# Patient Record
Sex: Male | Born: 1985 | Race: Black or African American | Hispanic: No | Marital: Single | Smoking: Current every day smoker
Health system: Southern US, Community
[De-identification: ages and names within clinical notes are randomized; demographics above are authoritative.]

## PROBLEM LIST (undated history)

## (undated) DIAGNOSIS — F209 Schizophrenia, unspecified: Secondary | ICD-10-CM

## (undated) DIAGNOSIS — Z21 Asymptomatic human immunodeficiency virus [HIV] infection status: Secondary | ICD-10-CM

## (undated) DIAGNOSIS — B20 Human immunodeficiency virus [HIV] disease: Secondary | ICD-10-CM

## (undated) DIAGNOSIS — R443 Hallucinations, unspecified: Secondary | ICD-10-CM

---
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
## 2008-03-14 ENCOUNTER — Emergency Department (HOSPITAL_COMMUNITY): Admission: EM | Admit: 2008-03-14 | Discharge: 2008-03-14 | Payer: Self-pay | Admitting: Emergency Medicine

## 2011-09-04 LAB — COMPREHENSIVE METABOLIC PANEL
ALT: 22
Alkaline Phosphatase: 62
BUN: 13
CO2: 24
Calcium: 8.9
GFR calc non Af Amer: >60
Glucose, Bld: 84
Potassium: 4
Sodium: 134 — ABNORMAL LOW

## 2011-09-04 LAB — DIFFERENTIAL
Basophils Relative: 0
Eosinophils Absolute: 0
Neutrophils Relative %: 35 — ABNORMAL LOW

## 2011-09-04 LAB — URINALYSIS, ROUTINE W REFLEX MICROSCOPIC
Ketones, ur: NEGATIVE
Leukocytes, UA: NEGATIVE
Nitrite: NEGATIVE
Protein, ur: 100 — AB
Urobilinogen, UA: 0.2

## 2011-09-04 LAB — CBC
HCT: 48.5
Hemoglobin: 17.3 — ABNORMAL HIGH
MCHC: 35.6
RBC: 5.64

## 2011-09-04 LAB — CK: Total CK: 1122 — ABNORMAL HIGH

## 2011-09-04 LAB — LIPASE, BLOOD: Lipase: 47

## 2015-06-22 ENCOUNTER — Encounter (HOSPITAL_COMMUNITY): Payer: Self-pay | Admitting: *Deleted

## 2015-06-22 ENCOUNTER — Emergency Department (HOSPITAL_COMMUNITY)
Admission: EM | Admit: 2015-06-22 | Discharge: 2015-06-23 | Disposition: A | Discharge status: Home / Self Care | Payer: Medicaid - Out of State | Attending: Emergency Medicine | Admitting: Emergency Medicine

## 2015-06-22 DIAGNOSIS — R45851 Suicidal ideations: Secondary | ICD-10-CM | POA: Diagnosis present

## 2015-06-22 DIAGNOSIS — R440 Auditory hallucinations: Secondary | ICD-10-CM | POA: Diagnosis not present

## 2015-06-22 DIAGNOSIS — F39 Unspecified mood [affective] disorder: Secondary | ICD-10-CM

## 2015-06-22 DIAGNOSIS — Z720 Tobacco use: Secondary | ICD-10-CM | POA: Insufficient documentation

## 2015-06-22 DIAGNOSIS — R51 Headache: Secondary | ICD-10-CM | POA: Diagnosis not present

## 2015-06-22 DIAGNOSIS — J029 Acute pharyngitis, unspecified: Secondary | ICD-10-CM | POA: Diagnosis not present

## 2015-06-22 HISTORY — DX: Schizophrenia, unspecified: F20.9

## 2015-06-22 HISTORY — DX: Hallucinations, unspecified: R44.3

## 2015-06-22 LAB — CBC WITH DIFFERENTIAL/PLATELET
BASOS PCT: 0 % (ref 0–1)
Basophils Absolute: 0 10*3/uL (ref 0.0–0.1)
EOS ABS: 0 10*3/uL (ref 0.0–0.7)
Eosinophils Relative: 1 % (ref 0–5)
HEMATOCRIT: 37.7 % — AB (ref 39.0–52.0)
Hemoglobin: 12 g/dL — ABNORMAL LOW (ref 13.0–17.0)
Lymphocytes Relative: 38 % (ref 12–46)
Lymphs Abs: 1.3 10*3/uL (ref 0.7–4.0)
MCH: 29.9 pg (ref 26.0–34.0)
MCHC: 31.8 g/dL (ref 30.0–36.0)
MCV: 93.8 fL (ref 78.0–100.0)
MONO ABS: 0.3 10*3/uL (ref 0.1–1.0)
Monocytes Relative: 8 % (ref 3–12)
NEUTROS ABS: 1.9 10*3/uL (ref 1.7–7.7)
NEUTROS PCT: 53 % (ref 43–77)
PLATELETS: 206 10*3/uL (ref 150–400)
RBC: 4.02 MIL/uL — ABNORMAL LOW (ref 4.22–5.81)
RDW: 13.7 % (ref 11.5–15.5)
WBC: 3.5 10*3/uL — ABNORMAL LOW (ref 4.0–10.5)

## 2015-06-22 LAB — COMPREHENSIVE METABOLIC PANEL
ALBUMIN: 3.4 g/dL — AB (ref 3.5–5.0)
ALT: 12 U/L — AB (ref 17–63)
AST: 26 U/L (ref 15–41)
Alkaline Phosphatase: 55 U/L (ref 38–126)
Anion gap: 6 (ref 5–15)
BILIRUBIN TOTAL: 0.5 mg/dL (ref 0.3–1.2)
BUN: 12 mg/dL (ref 6–20)
CO2: 31 mmol/L (ref 22–32)
CREATININE: 0.98 mg/dL (ref 0.61–1.24)
Calcium: 9 mg/dL (ref 8.9–10.3)
Chloride: 102 mmol/L (ref 101–111)
GFR calc non Af Amer: >60 mL/min (ref >60)
Glucose, Bld: 84 mg/dL (ref 65–99)
POTASSIUM: 3.5 mmol/L (ref 3.5–5.1)
Sodium: 139 mmol/L (ref 135–145)
Total Protein: 8.8 g/dL — ABNORMAL HIGH (ref 6.5–8.1)

## 2015-06-22 LAB — RAPID URINE DRUG SCREEN, HOSP PERFORMED
AMPHETAMINES: POSITIVE — AB
BARBITURATES: NOT DETECTED
Benzodiazepines: NOT DETECTED
COCAINE: NOT DETECTED
OPIATES: NOT DETECTED
Tetrahydrocannabinol: POSITIVE — AB

## 2015-06-22 LAB — ETHANOL: Alcohol, Ethyl (B): <5 mg/dL (ref <5)

## 2015-06-22 MED ORDER — LORAZEPAM 1 MG PO TABS: 1.0000 mg | ORAL_TABLET | Freq: Three times a day (TID) | ORAL | Status: DC | PRN

## 2015-06-22 MED ORDER — ONDANSETRON HCL 4 MG PO TABS: 4.0000 mg | ORAL_TABLET | Freq: Three times a day (TID) | ORAL | Status: DC | PRN

## 2015-06-22 MED ORDER — IBUPROFEN 200 MG PO TABS
600.0000 mg | ORAL_TABLET | Freq: Three times a day (TID) | ORAL | Status: DC | PRN
  Administered 2015-06-22 – 2015-06-23 (×3): 600 mg via ORAL
  Filled 2015-06-22 (×3): qty 3

## 2015-06-22 MED ORDER — ALUM & MAG HYDROXIDE-SIMETH 200-200-20 MG/5ML PO SUSP: 30.0000 mL | ORAL | Status: DC | PRN

## 2015-06-22 MED ORDER — ACETAMINOPHEN 325 MG PO TABS: 650.0000 mg | ORAL_TABLET | ORAL | Status: DC | PRN

## 2015-06-22 NOTE — ED Notes (Addendum)
Pt reports voices saying "failure, slut, nothing, die, die." Pt is looking around paranoid and states that he feels like something is following him. Pt says that he has heard voices of a conversation for the past two months.Pt says that two months ago he went to a shelter and the voices started. He also reports stress related to losing his uncle two years ago that he was close to.

## 2015-06-22 NOTE — ED Notes (Signed)
Pt states "I don't know if I want to kill someone or myself", pt states he is hearing voices, states he has conversations with people he knows is not there, pt also complaining of L throat pain and head pressure.

## 2015-06-22 NOTE — ED Notes (Signed)
Pt AAO x 3, denies SI, continues to  [employer] Media[employer] voices, calm & cooperative, no distress noted, watching TV at present.  Monitoring for safety, Q 15 min checks in effect.

## 2015-06-22 NOTE — ED Provider Notes (Signed)
CSN: [id number]     Arrival date & time 06/22/15  0605 History   First MD Initiated Contact with Patient 06/22/15 [id number]     Chief Complaint  Patient presents with  . Medical Clearance     (Consider location/radiation/quality/duration/timing/severity/associated sxs/prior Treatment) HPI Comments: Patient with h/o HIV on no medications, presents c/o hearing non-instructive voices x 2 months. States that they can be negative or positive. He states that sometimes he gets very angry and wants to hurt people but has no definite plan to hurt anyone in particular. He tells me that he does not really want to hurt himself. Also asks if contact lenses can transmit images elsewhere and that he has been paranoid lately. States he occasionally drinks wine. Denies other drugs. He states that [place] is [place] from [place] and [place]. 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  His only medical complaint is that he has had L sided sore throat and HA x 2 months as well. No fever, vision change, N/V/D. No URI sx. No difficulty breathing or swallowing.    The history is provided by the patient.    Past Medical History  Diagnosis Date  . Schizophrenia   . Hallucinations    History reviewed. No pertinent past surgical history. No family history on file. History  Substance Use Topics  . Smoking status: Current Every Day Smoker  . Smokeless tobacco: Never Used  . Alcohol Use: Yes    Review of Systems  Constitutional: Negative for fever.  HENT: Positive for sore throat. Negative for ear pain, rhinorrhea, trouble swallowing and voice change.   Eyes: Negative for redness.  Respiratory: Negative for cough.   Cardiovascular: Negative for chest pain.  Gastrointestinal: Negative for nausea, vomiting, abdominal pain and diarrhea.  Genitourinary: Negative for dysuria.  Musculoskeletal: Negative for myalgias.  Skin: Negative for  rash.  Neurological: Positive for headaches.    Allergies  Review of patient's allergies indicates not on file.  Home Medications   Prior to Admission medications   Not on File   BP 171/103 mmHg  Pulse 63  Temp(Src) 98.3 F (36.8 C) (Oral)  Resp 20  Ht 5\' 11"  (1.803 m)  Wt 187 lb (84.823 kg)  BMI 26.09 kg/m2  SpO2 100%   Physical Exam  Constitutional: He appears well-developed and well-nourished.  HENT:  Head: Normocephalic and atraumatic.  Right Ear: Tympanic membrane, external ear and ear canal normal.  Left Ear: Tympanic membrane, external ear and ear canal normal.  Nose: Nose normal. No mucosal edema or rhinorrhea.  Mouth/Throat: Oropharynx is clear and moist and mucous membranes are normal. Mucous membranes are not dry. No trismus in the jaw. No dental abscesses or uvula swelling. No oropharyngeal exudate, posterior oropharyngeal edema or posterior oropharyngeal erythema.  Eyes: Conjunctivae are normal. Right eye exhibits no discharge. Left eye exhibits no discharge.  Neck: Normal range of motion. Neck supple.  Cardiovascular: Normal rate, regular rhythm and normal heart sounds.   No murmur heard. Pulmonary/Chest: Effort normal and breath sounds normal. No respiratory distress. He has no wheezes. He has no rales.  Abdominal: Soft. There is no tenderness. There is no rebound and no guarding.  Neurological: He is alert.  Skin: Skin is warm and dry.  Psychiatric: His speech is normal. His affect is labile. He is actively hallucinating. He is not agitated, not aggressive, not hyperactive, not slowed, not withdrawn and not combative.  Thought content is paranoid. He expresses no homicidal and no suicidal ideation. He expresses no suicidal plans and no homicidal plans. He is attentive.  Nursing note and vitals reviewed.   ED Course  Procedures (including critical care time) Labs Review Labs Reviewed  CBC WITH DIFFERENTIAL/PLATELET - Abnormal; Notable for the following:     WBC 3.5 (*)    RBC 4.02 (*)    Hemoglobin 12.0 (*)    HCT 37.7 (*)    All other components within normal limits  COMPREHENSIVE METABOLIC PANEL - Abnormal; Notable for the following:    Total Protein 8.8 (*)    Albumin 3.4 (*)    ALT 12 (*)    All other components within normal limits  URINE RAPID DRUG SCREEN, HOSP PERFORMED - Abnormal; Notable for the following:    Amphetamines POSITIVE (*)    Tetrahydrocannabinol POSITIVE (*)    All other components within normal limits  ETHANOL    Imaging Review No results found.   EKG Interpretation None       6:29 AM Patient seen and examined. Medical clearance underway. No indications for IVC from history. He does not state any active SI/HI.    Vital signs reviewed and are as follows: BP 171/103 mmHg  Pulse 63  Temp(Src) 98.3 F (36.8 C) (Oral)  Resp 20  Ht  (1.803 m)  Wt 187 lb (84.823 kg)  BMI 26.09 kg/m2  SpO2 100%  7:54 AM Pt is medically cleared. UDS is positive for amphetamines and THC. Pending TTS eval.    MDM   Final diagnoses:  Auditory hallucinations   Pending completion of psych eval and disposition decision.     Renne Crigler, PA-C 06/22/15 1620  Layla Maw Ward, DO 06/23/15 0002

## 2015-06-22 NOTE — BH Assessment (Signed)
Sent referrals to: [facility]    Allyne Hebert, [place] Triage Specialist 06/22/2015 10:52 PM
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
## 2015-06-22 NOTE — ED Notes (Signed)
[id number] Expected date:  Expected time:  Means of arrival:  Comments: rm 7

## 2015-06-22 NOTE — ED Notes (Signed)
Pt reports recently experiencing the loss of his uncle which has caused strain on pt and his support system - pt admits to hx of auditory hallucinations, reports he has been again hearing voices for a few days, states they tell him to do things "sometimes to include fighting or hurting other people but i can't fight everyone, and sometimes to hurt myself." Pt denies suicidal plan. Pt calm and cooperative at present. Pt also admits to recent experimenting w/ drugs/medications - took x7 tabs of tramadol 2.5mg  approx 2 weeks ago, taking xanax and took a new street drug recently.

## 2015-06-23 DIAGNOSIS — R440 Auditory hallucinations: Secondary | ICD-10-CM

## 2015-06-23 MED ORDER — PSEUDOEPHEDRINE HCL ER 120 MG PO TB12: 240.0000 mg | ORAL_TABLET | Freq: Once | ORAL | Status: DC

## 2015-06-23 MED ORDER — LORATADINE 10 MG PO TABS
10.0000 mg | ORAL_TABLET | Freq: Once | ORAL | Status: AC
  Administered 2015-06-23: 10 mg via ORAL
  Filled 2015-06-23 (×2): qty 1

## 2015-06-23 MED ORDER — PSEUDOEPHEDRINE HCL ER 120 MG PO TB12
120.0000 mg | ORAL_TABLET | Freq: Once | ORAL | Status: AC
  Administered 2015-06-23: 120 mg via ORAL
  Filled 2015-06-23: qty 1

## 2015-06-23 NOTE — Discharge Instructions (Signed)
For your ongoing behavioral health needs you are advised to follow up with [facility].  New patients are seen at their walk-in clinic.  Walk-in hours are Monday - Friday from 8:00 am - 12:00 pm, and from 1:00 pm - 3:00 pm.  Walk-in patients are seen on a first come, first served basis, so try to arrive as early as possible for the best chance of being seen the same day.  There is an initial fee of $22.50: ° °     [facility] °     [address] °     [place], [place] [address] °     [phone] °

## 2015-06-23 NOTE — ED Notes (Addendum)
Bus pass given, shelter and [facility] given to [facility].  Pt ambulatory w/o difficulty to dc window w/ mHt.  Belongings returned after leaving the arera.

## 2015-06-23 NOTE — BH Assessment (Signed)
[facility] Assessment Progress Note  Per Thedore MinsMojeed Akintayo, MD, this pt does not require psychiatric hospitalization at this time.  [facility]  [facility]charge [facility]  Pt's nurse has been notified.  Doylene Canninghomas Nakiya Rallis, MA Triage Specialist [phone]

## 2015-06-23 NOTE — [facility] Suicide Risk Assessment (Cosign Needed)
Suicide Risk Assessment  Discharge Assessment   [facility]   Demographic Factors:  Male, Low socioeconomic status and Unemployed  Total Time spent with patient: 20 minutes  Musculoskeletal: Strength & Muscle Tone: within normal limits Gait & Station: normal Patient leans: N/A  Psychiatric Specialty Exam:     Blood pressure 145/89, pulse 76, temperature 98 F (36.7 C), temperature source Oral, resp. rate 18, height 5\' 11"  (1.803 m), weight 84.823 kg (187 lb), SpO2 97 %.Body mass index is 26.09 kg/(m^2).  General Appearance: Casual  Eye Contact::  Good  Speech:  Clear and Coherent and Normal Rate  Volume:  Normal  Mood:  Euthymic  Affect:  Congruent  Thought Process:  Coherent, Goal Directed and Intact  Orientation:  Full (Time, Place, and Person)  Thought Content:  WDL  Suicidal Thoughts:  No  Homicidal Thoughts:  No  Memory:  Immediate;   Fair Recent;   Fair Remote;   Fair  Judgement:  Fair  Insight:  Lacking  Psychomotor Activity:  Normal  Concentration:  Good  Recall:  NA  Fund of Knowledge:Poor  Language: Good  Akathisia:  NA  Handed:  Right  AIMS (if indicated):     Assets:  Desire for Improvement  ADL's:  Intact  Cognition: WNL  Sleep:           Has this patient used any form of tobacco in the last 30 days? (Cigarettes, Smokeless Tobacco, Cigars, and/or Pipes) No  Mental Status Per Nursing Assessment::   On Admission:     Current Mental Status by Physician: NA  Loss Factors: NA  Historical Factors: NA  Risk Reduction Factors:   Religious beliefs about death and Living with another person, especially a relative  Continued Clinical Symptoms:  Alcohol/Substance Abuse/Dependencies  Cognitive Features That Contribute To Risk:  Polarized thinking    Suicide Risk:  Minimal: No identifiable suicidal ideation.  Patients presenting with no risk factors but with morbid ruminations; may be classified as minimal risk based  on the severity of the depressive symptoms  Principal Problem: Mood disorder Discharge Diagnoses:  Patient Active Problem List   Diagnosis Date Noted  . Mood disorder [F39] 06/22/2015    Priority: High  . Auditory hallucinations [R44.0]       Plan Of Care/Follow-up recommendations:  Activity:  as tolerated Diet:  regular  Is patient on multiple antipsychotic therapies at discharge:  No   Has Patient had three or more failed trials of antipsychotic monotherapy by history:  No  Recommended Plan for Multiple Antipsychotic Therapies: NA    Jesiel Garate C   PMHNP-BC 06/23/2015, 11:23 AM
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
## 2015-06-23 NOTE — ED Notes (Addendum)
Written dc instructions reviewed w/ Pt.  [facility] and start treatment.  Pt encouraged to return/seek help for any suicidal/homicidal thoughts or urges.  Pt verbalized understanding. Pt also encouraged to follow up at the OP clinics for his medical problems.

## 2015-06-27 ENCOUNTER — Emergency Department (HOSPITAL_COMMUNITY)
Admission: EM | Admit: 2015-06-27 | Discharge: 2015-06-27 | Disposition: A | Discharge status: Home / Self Care | Payer: Medicaid - Out of State | Attending: Emergency Medicine | Admitting: Emergency Medicine

## 2015-06-27 ENCOUNTER — Encounter (HOSPITAL_COMMUNITY): Payer: Self-pay

## 2015-06-27 ENCOUNTER — Emergency Department (HOSPITAL_COMMUNITY): Payer: Medicaid - Out of State

## 2015-06-27 DIAGNOSIS — Z79899 Other long term (current) drug therapy: Secondary | ICD-10-CM | POA: Diagnosis not present

## 2015-06-27 DIAGNOSIS — K088 Other specified disorders of teeth and supporting structures: Secondary | ICD-10-CM | POA: Diagnosis present

## 2015-06-27 DIAGNOSIS — Z8659 Personal history of other mental and behavioral disorders: Secondary | ICD-10-CM | POA: Diagnosis not present

## 2015-06-27 DIAGNOSIS — M542 Cervicalgia: Secondary | ICD-10-CM | POA: Diagnosis not present

## 2015-06-27 DIAGNOSIS — Z720 Tobacco use: Secondary | ICD-10-CM | POA: Diagnosis not present

## 2015-06-27 DIAGNOSIS — J018 Other acute sinusitis: Secondary | ICD-10-CM | POA: Insufficient documentation

## 2015-06-27 MED ORDER — AMOXICILLIN 500 MG PO CAPS: 500.0000 mg | ORAL_CAPSULE | Freq: Three times a day (TID) | ORAL | Status: AC

## 2015-06-27 MED ORDER — ACETAMINOPHEN 325 MG PO TABS
650.0000 mg | ORAL_TABLET | Freq: Once | ORAL | Status: AC
  Administered 2015-06-27: 650 mg via ORAL
  Filled 2015-06-27: qty 2

## 2015-06-27 NOTE — ED Provider Notes (Signed)
CSN: [id number]     Arrival date & time 06/27/15  [id number] History  This chart was scribed for non-physician practitioner, Teressa LowerVrinda Donnavan Covault, NP, working with Pricilla LovelessScott Goldston, MD, by Budd PalmerVanessa Prueter ED Scribe. This patient was seen in room TR04C/TR04C and the patient's care was started at 8:16 PM    Chief Complaint  Patient presents with  . Dental Pain   The history is provided by the patient. No language interpreter was used.   HPI Comments: Carlos Wilkerson is a 29 y.o. male smoker who presents to the Emergency Department complaining of stabbing, left-sided dental pain onset a few days ago. Pt reports associated congestion, as well as left-sided head, facial, and throat pain. He currently rates his pain at a 5-6/10. He also notes intermittent left-sided chest pain. He reports a subjective fever when he first arrived. He was prescribed Claritin D by his PCP in [place], with no relief. He has come to [place] to take care of his grandmother. He has a PMHx of allergies. He does not take any other medications. Pt denies a PMHx of asthma.   Past Medical History  Diagnosis Date  . Schizophrenia   . Hallucinations    History reviewed. No pertinent past surgical history. No family history on file. History  Substance Use Topics  . Smoking status: Current Every Day Smoker  . Smokeless tobacco: Never Used  . Alcohol Use: Yes    Review of Systems  HENT: Positive for congestion, dental problem and sore throat.   Cardiovascular: Positive for chest pain (left-sided).  Musculoskeletal: Positive for neck pain.  All other systems reviewed and are negative.   Allergies  Review of patient's allergies indicates no known allergies.  Home Medications   Prior to Admission medications   Medication Sig Start Date End Date Taking? Authorizing Provider  loratadine-pseudoephedrine (CLARITIN-D 24-HOUR) 10-240 MG per 24 hr tablet Take 1 tablet by mouth daily.    Historical Provider, MD  Soft Lens Products (REWETTING  DROPS) SOLN Place 1 drop into both eyes 4 (four) times daily as needed (for dry eyes).    Historical Provider, MD   BP 147/84 mmHg  Pulse 80  Temp(Src) 98.4 F (36.9 C)  Ht 5\' 11"  (1.803 m)  Wt 187 lb (84.823 kg)  BMI 26.09 kg/m2 Physical Exam  Constitutional: He is oriented to person, place, and time. He appears well-developed and well-nourished. No distress.  HENT:  Head: Normocephalic and atraumatic.  Right Ear: External ear normal.  Left Ear: External ear normal.  Nose: Left sinus exhibits maxillary sinus tenderness and frontal sinus tenderness.  Mouth/Throat: Oropharynx is clear and moist.  Eyes: Conjunctivae and EOM are normal. Pupils are equal, round, and reactive to light.  Neck: Normal range of motion. Neck supple. No tracheal deviation present.  Cardiovascular: Normal rate.   Pulmonary/Chest: Breath sounds normal. No respiratory distress.  Abdominal: Soft.  Musculoskeletal: Normal range of motion.  Neurological: He is alert and oriented to person, place, and time.  Skin: Skin is warm and dry.  Psychiatric: He has a normal mood and affect. His behavior is normal.  Nursing note and vitals reviewed.   ED Course  Procedures  DIAGNOSTIC STUDIES:     COORDINATION OF CARE: 8:19 PM - Discussed plans to order a chest XR. Pt advised of plan for treatment and pt agrees.  Labs Review Labs Reviewed - No data to display  Imaging Review Dg Chest 2 View  06/27/2015   CLINICAL DATA:  29 year old male with chest pain  EXAM: CHEST  2 VIEW  COMPARISON:  None.  FINDINGS: The heart size and mediastinal contours are within normal limits. Both lungs are clear. The visualized skeletal structures are unremarkable.  IMPRESSION: No active cardiopulmonary disease.   Electronically Signed   By: Elgie Collard M.D.   On: 06/27/2015 21:09   ED ECG REPORT   Date: 06/27/2015  Rate: 73  Rhythm: sinus arrhythmia  QRS Axis: normal  Intervals: normal  ST/T Wave abnormalities: normal   Conduction Disutrbances:none  Narrative Interpretation:   Old EKG Reviewed: none available  I have personally reviewed the EKG tracing and agree with the computerized printout as noted.   MDM   Final diagnoses:  Other acute sinusitis    Doubt acs. Will treat for sinusitis with amoxicillin. Likely bronchitic symptoms  I personally performed the services described in this documentation, which was scribed in my presence. The recorded information has been reviewed and is accurate.  Teressa Lower, NP 06/27/15 1610  Pricilla Loveless, MD 06/29/15 1910

## 2015-06-27 NOTE — ED Notes (Signed)
NP at the bedside

## 2015-06-27 NOTE — Discharge Instructions (Signed)
Sinusitis °Sinusitis is redness, soreness, and puffiness (inflammation) of the air pockets in the bones of your face (sinuses). The redness, soreness, and puffiness can cause air and mucus to get trapped in your sinuses. This can allow germs to grow and cause an infection.  °HOME CARE  °· Drink enough fluids to keep your pee (urine) clear or pale yellow. °· Use a humidifier in your home. °· Run a hot shower to create steam in the bathroom. Sit in the bathroom with the door closed. Breathe in the steam 3-4 times a day. °· Put a warm, moist washcloth on your face 3-4 times a day, or as told by your doctor. °· Use salt water sprays (saline sprays) to wet the thick fluid in your nose. This can help the sinuses drain. °· Only take medicine as told by your doctor. °GET HELP RIGHT AWAY IF:  °· Your pain gets worse. °· You have very bad headaches. °· You are sick to your stomach (nauseous). °· You throw up (vomit). °· You are very sleepy (drowsy) all the time. °· Your face is puffy (swollen). °· Your vision changes. °· You have a stiff neck. °· You have trouble breathing. °MAKE SURE YOU:  °· Understand these instructions. °· Will watch your condition. °· Will get help right away if you are not doing well or get worse. °Document Released: 05/14/2008 Document Revised: 08/20/2012 Document Reviewed: 07/01/2012 °[employer]® Patient Information ©2015 [employer]. This information is not intended to replace advice given to you by your health care provider. Make sure you discuss any questions you have with your health care provider. ° °

## 2015-06-27 NOTE — ED Notes (Signed)
Per Patient, Patient started to have pain in his mouth on the left side that is a stabbing pain. Patient reports left sided throat pain and congestion. Patient reports having history of allergies and headache. Patient has been taking Claritin D with no relief.

## 2017-01-03 ENCOUNTER — Emergency Department (HOSPITAL_COMMUNITY)
Admission: EM | Admit: 2017-01-03 | Discharge: 2017-01-04 | Disposition: A | Discharge status: Home / Self Care | Payer: Medicaid Other | Attending: Emergency Medicine | Admitting: Emergency Medicine

## 2017-01-03 ENCOUNTER — Encounter (HOSPITAL_COMMUNITY): Payer: Self-pay | Admitting: Emergency Medicine

## 2017-01-03 DIAGNOSIS — F17200 Nicotine dependence, unspecified, uncomplicated: Secondary | ICD-10-CM | POA: Insufficient documentation

## 2017-01-03 DIAGNOSIS — G43909 Migraine, unspecified, not intractable, without status migrainosus: Secondary | ICD-10-CM | POA: Insufficient documentation

## 2017-01-03 DIAGNOSIS — J019 Acute sinusitis, unspecified: Secondary | ICD-10-CM | POA: Diagnosis not present

## 2017-01-03 NOTE — ED Triage Notes (Signed)
PT RECEIVED FROM HOME VIA EMS C/O MIGRAINE X2-3 WEEKS. PER EMS, THE PT ALSO C/O BLURRED VISION AND LIGHT SENSITIVITY. PAIN IS 9/10.

## 2017-01-04 MED ORDER — DIPHENHYDRAMINE HCL 25 MG PO CAPS
25.0000 mg | ORAL_CAPSULE | Freq: Once | ORAL | Status: AC
  Administered 2017-01-04: 25 mg via ORAL
  Filled 2017-01-04: qty 1

## 2017-01-04 MED ORDER — SODIUM CHLORIDE 0.9 % IV BOLUS (SEPSIS)
1000.0000 mL | Freq: Once | INTRAVENOUS | Status: AC
  Administered 2017-01-04: 1000 mL via INTRAVENOUS

## 2017-01-04 MED ORDER — METOCLOPRAMIDE HCL 5 MG/ML IJ SOLN
10.0000 mg | Freq: Once | INTRAMUSCULAR | Status: DC
  Filled 2017-01-04: qty 2

## 2017-01-04 MED ORDER — METOCLOPRAMIDE HCL 10 MG PO TABS
10.0000 mg | ORAL_TABLET | Freq: Once | ORAL | Status: AC
  Administered 2017-01-04: 10 mg via ORAL
  Filled 2017-01-04: qty 1

## 2017-01-04 MED ORDER — PSEUDOEPHEDRINE HCL ER 120 MG PO TB12: 120.0000 mg | ORAL_TABLET | Freq: Two times a day (BID) | ORAL | 0 refills | Status: AC

## 2017-01-04 MED ORDER — DEXAMETHASONE 4 MG PO TABS
10.0000 mg | ORAL_TABLET | Freq: Once | ORAL | Status: AC
  Administered 2017-01-04: 10 mg via ORAL
  Filled 2017-01-04: qty 2

## 2017-01-04 MED ORDER — HALOPERIDOL LACTATE 5 MG/ML IJ SOLN
2.0000 mg | Freq: Once | INTRAMUSCULAR | Status: AC
  Administered 2017-01-04: 2 mg via INTRAVENOUS
  Filled 2017-01-04: qty 1

## 2017-01-04 MED ORDER — FLUTICASONE PROPIONATE 50 MCG/ACT NA SUSP: 2.0000 | Freq: Every day | NASAL | 0 refills | Status: AC

## 2017-01-04 MED ORDER — KETOROLAC TROMETHAMINE 15 MG/ML IJ SOLN
15.0000 mg | Freq: Once | INTRAMUSCULAR | Status: AC
  Administered 2017-01-04: 15 mg via INTRAVENOUS
  Filled 2017-01-04: qty 1

## 2017-01-04 MED ORDER — AMOXICILLIN-POT CLAVULANATE 875-125 MG PO TABS: 1.0000 | ORAL_TABLET | Freq: Two times a day (BID) | ORAL | 0 refills | Status: AC

## 2017-01-04 MED ORDER — DIPHENHYDRAMINE HCL 50 MG/ML IJ SOLN
25.0000 mg | Freq: Once | INTRAMUSCULAR | Status: DC
  Filled 2017-01-04: qty 1

## 2017-01-04 NOTE — ED Provider Notes (Signed)
[facility]-EMERGENCY DEPT Provider Note   CSN: [id number] Arrival date & time: 01/03/17  1936     History   Chief Complaint Chief Complaint  Patient presents with  . Migraine  . URI    HPI Carlos Wilkerson is a 31 y.o. male.  HPI 31 yo M with h/o schizophrenia, chronic HA here with headache, sinus congestion. Pt endorses 1 week of nasal congestion, sore throat, and sensation of postnasal drainage. Over the past several days, he has developed an aching, throbbing, left sided HA similar ot his usual migraines. No photophobia, phonophobia, or neck stiffness. HA feels similar to his usual migraines. No known fevers. He does have seasonal allergies. HA worsens with loud noises. Has not tried any medications. Has not seen a neurologist. He has had negative CT in past per report.   Past Medical History:  Diagnosis Date  . Hallucinations   . Schizophrenia [facility])     Patient Active Problem List   Diagnosis Date Noted  . Auditory hallucinations   . Mood disorder (HCC) 06/22/2015    History reviewed. No pertinent surgical history.     Home Medications    Prior to Admission medications   Medication Sig Start Date End Date Taking? Authorizing Provider  amoxicillin (AMOXIL) 500 MG capsule Take 1 capsule (500 mg total) by mouth 3 (three) times daily. 06/27/15   Teressa Lower, NP  amoxicillin-clavulanate (AUGMENTIN) 875-125 MG tablet Take 1 tablet by mouth every 12 (twelve) hours. 01/04/17 01/14/17  Shaune Pollack, MD  fluticasone (FLONASE) 50 MCG/ACT nasal spray Place 2 sprays into both nostrils daily. 01/04/17 02/03/17  Shaune Pollack, MD  loratadine-pseudoephedrine (CLARITIN-D 24-HOUR) 10-240 MG per 24 hr tablet Take 1 tablet by mouth daily.    Historical Provider, MD  pseudoephedrine (SUDAFED 12 HOUR) 120 MG 12 hr tablet Take 1 tablet (120 mg total) by mouth 2 (two) times daily. 01/04/17 01/11/17  Shaune Pollack, MD  Soft Lens Products (REWETTING DROPS) SOLN Place 1 drop into both eyes 4  (four) times daily as needed (for dry eyes).    Historical Provider, MD    Family History No family history on file.  Social History Social History  Substance Use Topics  . Smoking status: Current Every Day Smoker  . Smokeless tobacco: Never Used  . Alcohol use No     Allergies   Patient has no known allergies.   Review of Systems Review of Systems  Constitutional: Positive for fatigue. Negative for chills and fever.  HENT: Positive for congestion, postnasal drip, rhinorrhea and sore throat.   Eyes: Negative for visual disturbance.  Respiratory: Positive for cough. Negative for shortness of breath and wheezing.   Cardiovascular: Negative for chest pain and leg swelling.  Gastrointestinal: Negative for abdominal pain, diarrhea, nausea and vomiting.  Genitourinary: Negative for dysuria and flank pain.  Musculoskeletal: Negative for neck pain and neck stiffness.  Skin: Negative for rash and wound.  Allergic/Immunologic: Negative for immunocompromised state.  Neurological: Positive for headaches. Negative for syncope and weakness.  Psychiatric/Behavioral: Negative for suicidal ideas.  All other systems reviewed and are negative.    Physical Exam Updated Vital Signs BP 117/71   Pulse 61   Temp 98.3 F (36.8 C) (Oral)   Resp 20   Ht 5\' 11"  (1.803 m)   Wt 190 lb (86.2 kg)   SpO2 95%   BMI 26.50 kg/m   Physical Exam  Constitutional: He is oriented to person, place, and time. He appears well-developed and well-nourished. No distress.  HENT:  Head: Normocephalic and atraumatic.  Right Ear: Tympanic membrane and external ear normal.  Left Ear: Tympanic membrane and external ear normal.  Nose: Mucosal edema present. Right sinus exhibits frontal sinus tenderness. Left sinus exhibits frontal sinus tenderness.  Mouth/Throat: Uvula is midline, oropharynx is clear and moist and mucous membranes are normal. No posterior oropharyngeal edema or posterior oropharyngeal erythema.    Eyes: Conjunctivae are normal.  Neck: Neck supple.  Cardiovascular: Normal rate, regular rhythm and normal heart sounds.  Exam reveals no friction rub.   No murmur heard. Pulmonary/Chest: Effort normal and breath sounds normal. No respiratory distress. He has no wheezes. He has no rales.  Abdominal: He exhibits no distension.  Musculoskeletal: He exhibits no edema.  Neurological: He is alert and oriented to person, place, and time. He exhibits normal muscle tone.  Skin: Skin is warm. Capillary refill takes less than 2 seconds.  Psychiatric: He has a normal mood and affect. His speech is normal and behavior is normal. Thought content normal. Thought content is not paranoid and not delusional. He expresses no homicidal and no suicidal ideation.  Nursing note and vitals reviewed.   Neurological Exam:  Mental Status: Alert and oriented to person, place, and time. Attention and concentration normal. Speech clear. Recent memory is intact. Cranial Nerves: Visual fields grossly intact. EOMI and PERRLA. No nystagmus noted. Facial sensation intact at forehead, maxillary cheek, and chin/mandible bilaterally. No facial asymmetry or weakness. Hearing grossly normal. Uvula is midline, and palate elevates symmetrically. Normal SCM and trapezius strength. Tongue midline without fasciculations. Motor: Muscle strength 5/5 in proximal and distal UE and LE bilaterally. No pronator drift. Muscle tone normal. Reflexes: 2+ and symmetrical in all four extremities.  Sensation: Intact to light touch in upper and lower extremities distally bilaterally.  Gait: Normal without ataxia. Coordination: Normal FTN bilaterally.   ED Treatments / Results  Labs (all labs ordered are listed, but only abnormal results are displayed) Labs Reviewed - No data to display  EKG  EKG Interpretation None       Radiology No results found.  Procedures Procedures (including critical care time)  Medications Ordered in  ED Medications  dexamethasone (DECADRON) tablet 10 mg (10 mg Oral Given 01/04/17 0105)  metoCLOPramide (REGLAN) tablet 10 mg (10 mg Oral Given 01/04/17 0115)  diphenhydrAMINE (BENADRYL) capsule 25 mg (25 mg Oral Given 01/04/17 0115)  sodium chloride 0.9 % bolus 1,000 mL (0 mLs Intravenous Stopped 01/04/17 0445)  ketorolac (TORADOL) 15 MG/ML injection 15 mg (15 mg Intravenous Given 01/04/17 0230)  haloperidol lactate (HALDOL) injection 2 mg (2 mg Intravenous Given 01/04/17 0233)     Initial Impression / Assessment and Plan / ED Course  I have reviewed the triage vital signs and the nursing notes.  Pertinent labs & imaging results that were available during my care of the patient were reviewed by me and considered in my medical decision making (see chart for details).     31 yo M with PMHx as above here with sinus congestion, frontal HA, and cough. Suspect viral URi with possible superimposed sinusitis. Will treat with flonase, augmentin. No fever, neck stiffness, or s/s meningitis or encephalitis. His HA is similar to his chronic migraines and I do not suspect SAH, intracranial mass/lesion, or infectious process. Neuro exam is non-focal. Pt given migraine cocktail here with improvement. Will d/c home.  Of note, pt does have h/o schizophrenia but is well appearing, linear, with no HI, SI, or AVH. No signs of acute  psych disorder.  Final Clinical Impressions(s) / ED Diagnoses   Final diagnoses:  Acute non-recurrent sinusitis, unspecified location  Migraine without status migrainosus, not intractable, unspecified migraine type    New Prescriptions Discharge Medication List as of 01/04/2017 12:51 AM    START taking these medications   Details  amoxicillin-clavulanate (AUGMENTIN) 875-125 MG tablet Take 1 tablet by mouth every 12 (twelve) hours., Starting Fri 01/04/2017, Until Mon 01/14/2017, Print    fluticasone (FLONASE) 50 MCG/ACT nasal spray Place 2 sprays into both nostrils daily., Starting  Fri 01/04/2017, Until Sun 02/03/2017, Print    pseudoephedrine (SUDAFED 12 HOUR) 120 MG 12 hr tablet Take 1 tablet (120 mg total) by mouth 2 (two) times daily., Starting Fri 01/04/2017, Until Fri 01/11/2017, Print         Shaune Pollack, MD 01/04/17 [id number]

## 2017-01-04 NOTE — ED Notes (Signed)
Pt requested to speak with provider before being discharged. Informed Dr. Raynaldo OpitzIsaac's.

## 2020-03-14 ENCOUNTER — Emergency Department (HOSPITAL_COMMUNITY)
Admission: EM | Admit: 2020-03-14 | Discharge: 2020-03-14 | Disposition: A | Discharge status: Home / Self Care | Payer: Medicaid Other | Attending: Emergency Medicine | Admitting: Emergency Medicine

## 2020-03-14 ENCOUNTER — Emergency Department (HOSPITAL_COMMUNITY): Payer: Medicaid Other

## 2020-03-14 ENCOUNTER — Other Ambulatory Visit: Payer: Self-pay

## 2020-03-14 ENCOUNTER — Encounter (HOSPITAL_COMMUNITY): Payer: Self-pay

## 2020-03-14 DIAGNOSIS — M25571 Pain in right ankle and joints of right foot: Secondary | ICD-10-CM | POA: Diagnosis present

## 2020-03-14 DIAGNOSIS — M86671 Other chronic osteomyelitis, right ankle and foot: Secondary | ICD-10-CM | POA: Diagnosis not present

## 2020-03-14 DIAGNOSIS — F1210 Cannabis abuse, uncomplicated: Secondary | ICD-10-CM | POA: Insufficient documentation

## 2020-03-14 DIAGNOSIS — F17200 Nicotine dependence, unspecified, uncomplicated: Secondary | ICD-10-CM | POA: Insufficient documentation

## 2020-03-14 DIAGNOSIS — B20 Human immunodeficiency virus [HIV] disease: Secondary | ICD-10-CM | POA: Insufficient documentation

## 2020-03-14 HISTORY — DX: Asymptomatic human immunodeficiency virus (hiv) infection status: Z21

## 2020-03-14 HISTORY — DX: Human immunodeficiency virus (HIV) disease: B20

## 2020-03-14 MED ORDER — KETOROLAC TROMETHAMINE 30 MG/ML IJ SOLN
30.0000 mg | Freq: Once | INTRAMUSCULAR | Status: AC
  Administered 2020-03-14: 30 mg via INTRAMUSCULAR
  Filled 2020-03-14: qty 1

## 2020-03-14 NOTE — Discharge Instructions (Addendum)
Thank you for allowing me to care for you today in the Emergency Department.   Take 650 mg of Tylenol or 600 mg of ibuprofen with food every 6 hours for pain.  You can alternate between these 2 medications every 3 hours if your pain returns.  For instance, you can take Tylenol at noon, followed by a dose of ibuprofen at 3, followed by second dose of Tylenol and 6.  Use the crutches until you can bear weight on your right foot without significant pain.  Try to elevate your right leg so that your toes are at or above the level of your nose to help with pain and swelling.  You can apply an ice pack for 15 to 20 minutes up to 3-4 times a day to help with pain and swelling.  Please follow-up with the wound clinic if you remain in [place].  If you return to [place], please follow-up with your orthopedist.  Please establish care if you moved to another area like [place], which you discussed during your visit today.  Return to the emergency department if you start to have thick, mucus-like drainage from the area, fevers, chills, or other new, concerning symptoms.
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
## 2020-03-15 ENCOUNTER — Other Ambulatory Visit: Payer: Self-pay

## 2020-03-15 DIAGNOSIS — R7989 Other specified abnormal findings of blood chemistry: Secondary | ICD-10-CM | POA: Insufficient documentation

## 2020-03-15 DIAGNOSIS — M79671 Pain in right foot: Secondary | ICD-10-CM | POA: Insufficient documentation
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
## 2020-03-16 ENCOUNTER — Emergency Department (HOSPITAL_COMMUNITY)
Admission: EM | Admit: 2020-03-16 | Discharge: 2020-03-16 | Disposition: A | Discharge status: Home / Self Care | Payer: Medicaid Other | Attending: Emergency Medicine | Admitting: Emergency Medicine

## 2020-03-16 ENCOUNTER — Ambulatory Visit (HOSPITAL_COMMUNITY): Payer: Medicaid Other | Attending: Emergency Medicine

## 2020-03-16 DIAGNOSIS — M79671 Pain in right foot: Secondary | ICD-10-CM

## 2020-03-16 DIAGNOSIS — R7989 Other specified abnormal findings of blood chemistry: Secondary | ICD-10-CM

## 2020-03-16 LAB — D-DIMER, QUANTITATIVE: D-Dimer, Quant: 0.72 ug/mL-FEU — ABNORMAL HIGH (ref 0.00–0.50)

## 2020-03-16 MED ORDER — ENOXAPARIN SODIUM 100 MG/ML ~~LOC~~ SOLN
1.0000 mg/kg | Freq: Once | SUBCUTANEOUS | Status: AC
  Administered 2020-03-16: 90 mg via SUBCUTANEOUS
  Filled 2020-03-16: qty 0.9

## 2020-03-16 MED ORDER — TRAMADOL HCL 50 MG PO TABS
50.0000 mg | ORAL_TABLET | Freq: Once | ORAL | Status: AC
  Administered 2020-03-16: 50 mg via ORAL
  Filled 2020-03-16: qty 1

## 2020-03-16 NOTE — ED Provider Notes (Signed)
[facility]-EMERGENCY DEPT Provider Note   CSN: [id number] Arrival date & time: 03/15/20  2201     History Chief Complaint  Patient presents with  . Foot Pain    Carlos Wilkerson is a 34 y.o. adult.  HPI     This is a 34 year old transgender male with a history of HIV and schizophrenia who presents with continued right foot pain and ankle swelling.  Was seen and evaluated approximately 48 hours ago for same.  He is visiting [place] after his grandmother died.  He is originally from [place].  He has remote history of right foot surgery 7 months ago and subsequent chronic osteomyelitis.  He is followed in [place].  Patient reports that since he traveled to [place] he has had increasing right foot swelling and pain.  It is worse with ambulation.  Rates his pain at 5 out of 10.  He is not taking anything for the pain.  Denies any fevers or skin changes.  Past Medical History:  Diagnosis Date  . Hallucinations   . HIV (human immunodeficiency virus infection) (HCC)   . Schizophrenia [facility])     Patient Active Problem List   Diagnosis Date Noted  . Auditory hallucinations   . Mood disorder (HCC) 06/22/2015    No past surgical history on file.     No family history on file.  Social History   Tobacco Use  . Smoking status: Current Every Day Smoker  . Smokeless tobacco: Never Used  Substance Use Topics  . Alcohol use: No  . Drug use: Yes    Types: Marijuana    Home Medications Prior to Admission medications   Medication Sig Start Date End Date Taking? Authorizing Provider  amoxicillin (AMOXIL) 500 MG capsule Take 1 capsule (500 mg total) by mouth 3 (three) times daily. 06/27/15   Teressa Lower, NP  fluticasone (FLONASE) 50 MCG/ACT nasal spray Place 2 sprays into both nostrils daily. 01/04/17 02/03/17  Shaune Pollack, MD  loratadine-pseudoephedrine (CLARITIN-D 24-HOUR) 10-240 MG per 24 hr tablet Take 1 tablet by mouth daily.     [provider]  Soft Lens Products (REWETTING DROPS) SOLN Place 1 drop into both eyes 4 (four) times daily as needed (for dry eyes).    [provider]    Allergies    Patient has no known allergies.  Review of Systems   Review of Systems  Constitutional: Negative for fever.  Respiratory: Negative for shortness of breath.   Cardiovascular: Negative for chest pain.  Gastrointestinal: Negative for abdominal pain.  Musculoskeletal:       Right ankle pain  Skin: Negative for color change and wound.  All other systems reviewed and are negative.   Physical Exam Updated Vital Signs BP (!) 176/99 (BP Location: Right Arm)   Pulse 74   Temp 98.1 F (36.7 C) (Oral)   Resp 18   SpO2 100%   Physical Exam Vitals and nursing note reviewed.  Constitutional:      Appearance: He is well-developed. He is not ill-appearing.     Comments: Falls asleep on exam  HENT:     Head: Normocephalic and atraumatic.     Mouth/Throat:     Mouth: Mucous membranes are moist.  Eyes:     Pupils: Pupils are equal, round, and reactive to light.  Cardiovascular:     Rate and Rhythm: Normal rate and regular rhythm.  Pulmonary:     Effort: Pulmonary effort is normal. No respiratory  distress.  Abdominal:     Palpations: Abdomen is soft.  Musculoskeletal:     Cervical back: Neck supple.     Comments: Slight swelling noted about the right ankle with limited range of motion, there is scar noted over the lateral aspect of the ankle without associated erythema, 2+ DP pulse, no lower extremity swelling or tenderness of the calf  Skin:    General: Skin is warm and dry.  Neurological:     Mental Status: He is alert and oriented to person, place, and time.  Psychiatric:     Comments: Bizarre     ED Results / Procedures / Treatments   Labs (all labs ordered are listed, but only abnormal results are displayed) Labs Reviewed  D-DIMER, QUANTITATIVE (NOT AT [facility]) - Abnormal; Notable for the  following components:      Result Value   D-Dimer, Quant 0.72 (*)    All other components within normal limits    EKG None  Radiology DG Ankle Complete Right  Result Date: 03/14/2020 CLINICAL DATA:  Right foot and ankle pain. Remote history of trauma. EXAM: RIGHT ANKLE - COMPLETE 3+ VIEW COMPARISON:  None. FINDINGS: Soft tissue swelling and ankle joint effusion. There is osteopenia of the ankle and visualized foot. Postoperative changes to the calcaneus which was likely previously fractured given patchy sclerotic appearance with flattening of Bohler's angle. IMPRESSION: 1. Soft tissue swelling and ankle joint effusion. 2. Posttraumatic/postoperative changes at the calcaneus. 3. No acute fracture or erosion. Electronically Signed   By: Monte Fantasia M.D.   On: 03/14/2020 05:03   DG Foot Complete Right  Result Date: 03/14/2020 CLINICAL DATA:  Right foot pain. History of prior trauma 7 months ago and surgery. EXAM: RIGHT FOOT COMPLETE - 3+ VIEW COMPARISON:  None. FINDINGS: Evidence of prior complex comminuted calcaneal fractures with evidence of hardware removal. Some type of metallic suture noted in the posterior calcaneus. No acute bony findings or destructive bony changes. IMPRESSION: 1. Remote posttraumatic changes involving the calcaneus. 2. No acute bony findings. Electronically Signed   By: Marijo Sanes M.D.   On: 03/14/2020 05:08    Procedures Procedures (including critical care time)  Medications Ordered in ED Medications  enoxaparin (LOVENOX) injection 90 mg (has no administration in time range)  traMADol (ULTRAM) tablet 50 mg (50 mg Oral Given 03/16/20 0104)    ED Course  I have reviewed the triage vital signs and the nursing notes.  Pertinent labs & imaging results that were available during my care of the patient were reviewed by me and considered in my medical decision making (see chart for details).    MDM Rules/Calculators/A&P                       Patient presents  with ongoing right foot pain and swelling.  Seen and evaluated 48 hours ago for the same.  History of chronic osteomyelitis.  He is acutely non-ill-appearing and vital signs are notable for blood pressure of 176/99.  Afebrile.  No signs or symptoms of cellulitis on exam.  There is some decreased means of motion but this is likely secondary to prior surgery.  I suspect that he is having pain from some to be dependent edema related to recent travel.  While he does not have any lower extremity calf swelling, blood clot is a consideration.  D-dimer was sent and was 0.72.  Patient was given one dose of subcu Lovenox.  Labs were reviewed from his  prior ED visit at an outside facility in late March and his creatinine and platelets were reassuring at that time.  Will recommend right lower extremity Doppler ultrasound tomorrow at [facility].  After history, exam, and medical workup I feel the patient has been appropriately medically screened and is safe for discharge home. Pertinent diagnoses were discussed with the patient. Patient was given return precautions.   Final Clinical Impression(s) / ED Diagnoses Final diagnoses:  Foot pain, right  Positive D dimer    Rx / DC Orders ED Discharge Orders         Ordered    [facility]ENOUS     03/16/20 5465           Shon Baton, MD 03/16/20 [phone]

## 2020-03-16 NOTE — Discharge Instructions (Addendum)
You were seen today for ongoing foot pain and swelling.  This is likely related to chronic osteomyelitis.  However, given your recent travel, you need to go to [facility] tomorrow for imaging to rule out a blood clot.  Take naproxen or Tylenol as needed for pain.  Keep the extremity elevated.

## 2020-10-03 IMAGING — DX DG FOOT COMPLETE 3+V*R*
3 series · 3 of 3 positions shown · non-contrast
Comparison: None.

CLINICAL DATA: Right foot pain. History of prior trauma 7 months
ago and surgery.

EXAM:
RIGHT FOOT COMPLETE - 3+ VIEW

[foot ap]
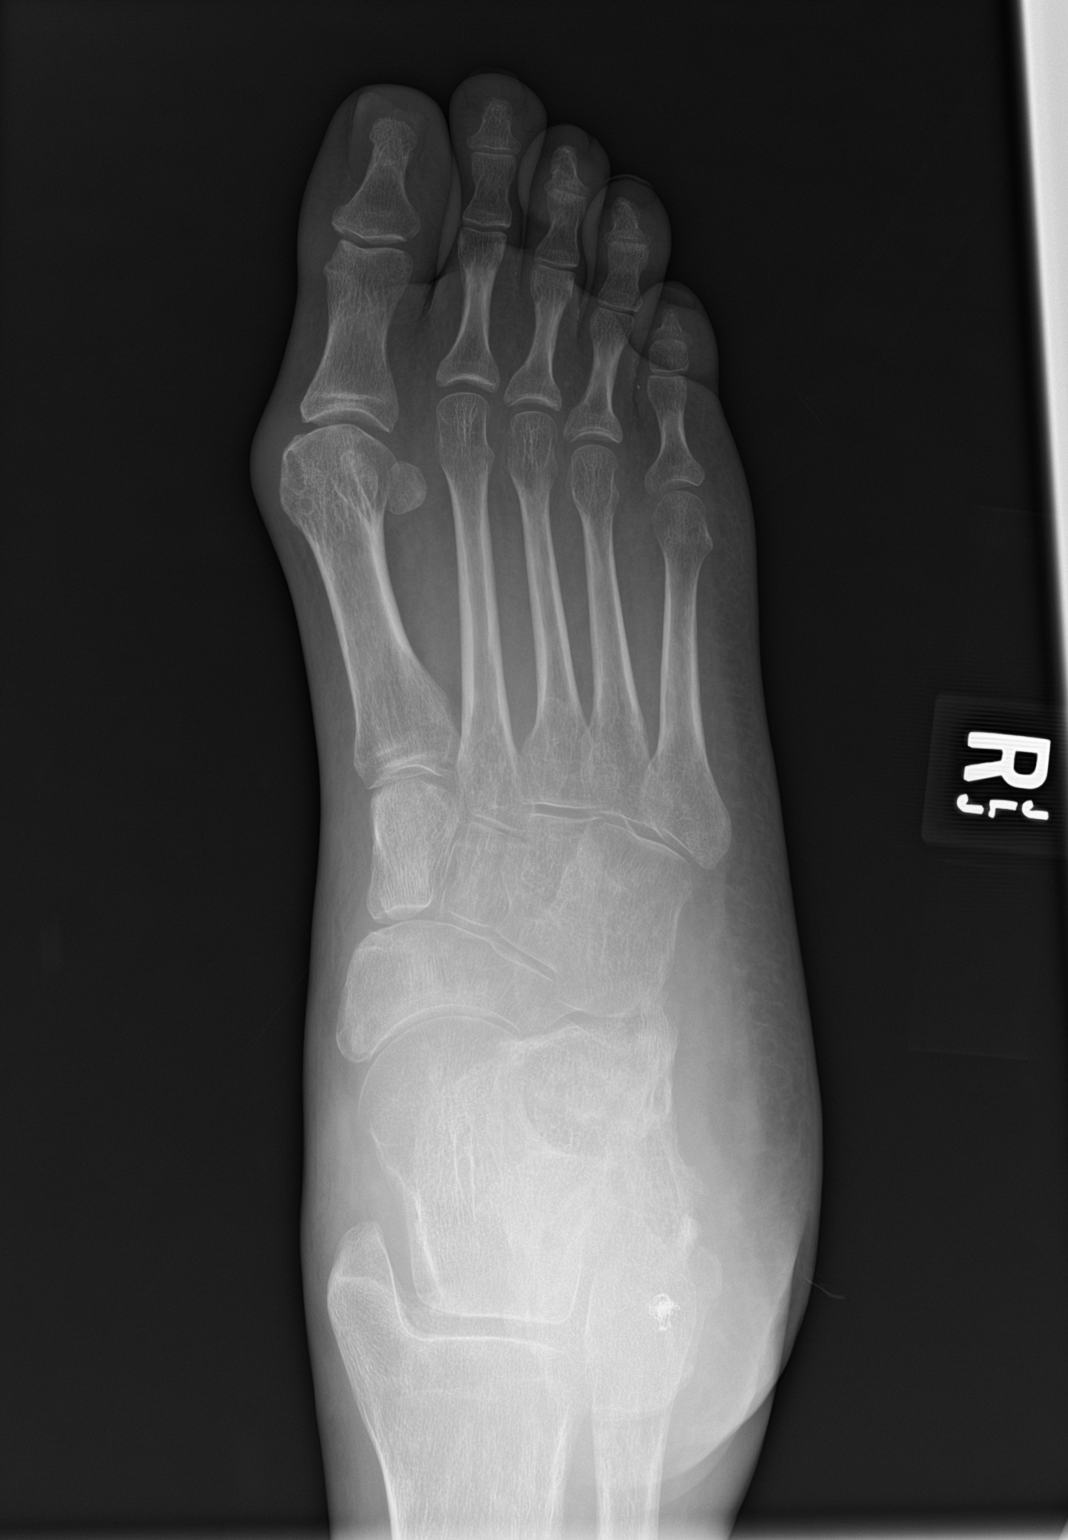

[foot obl]
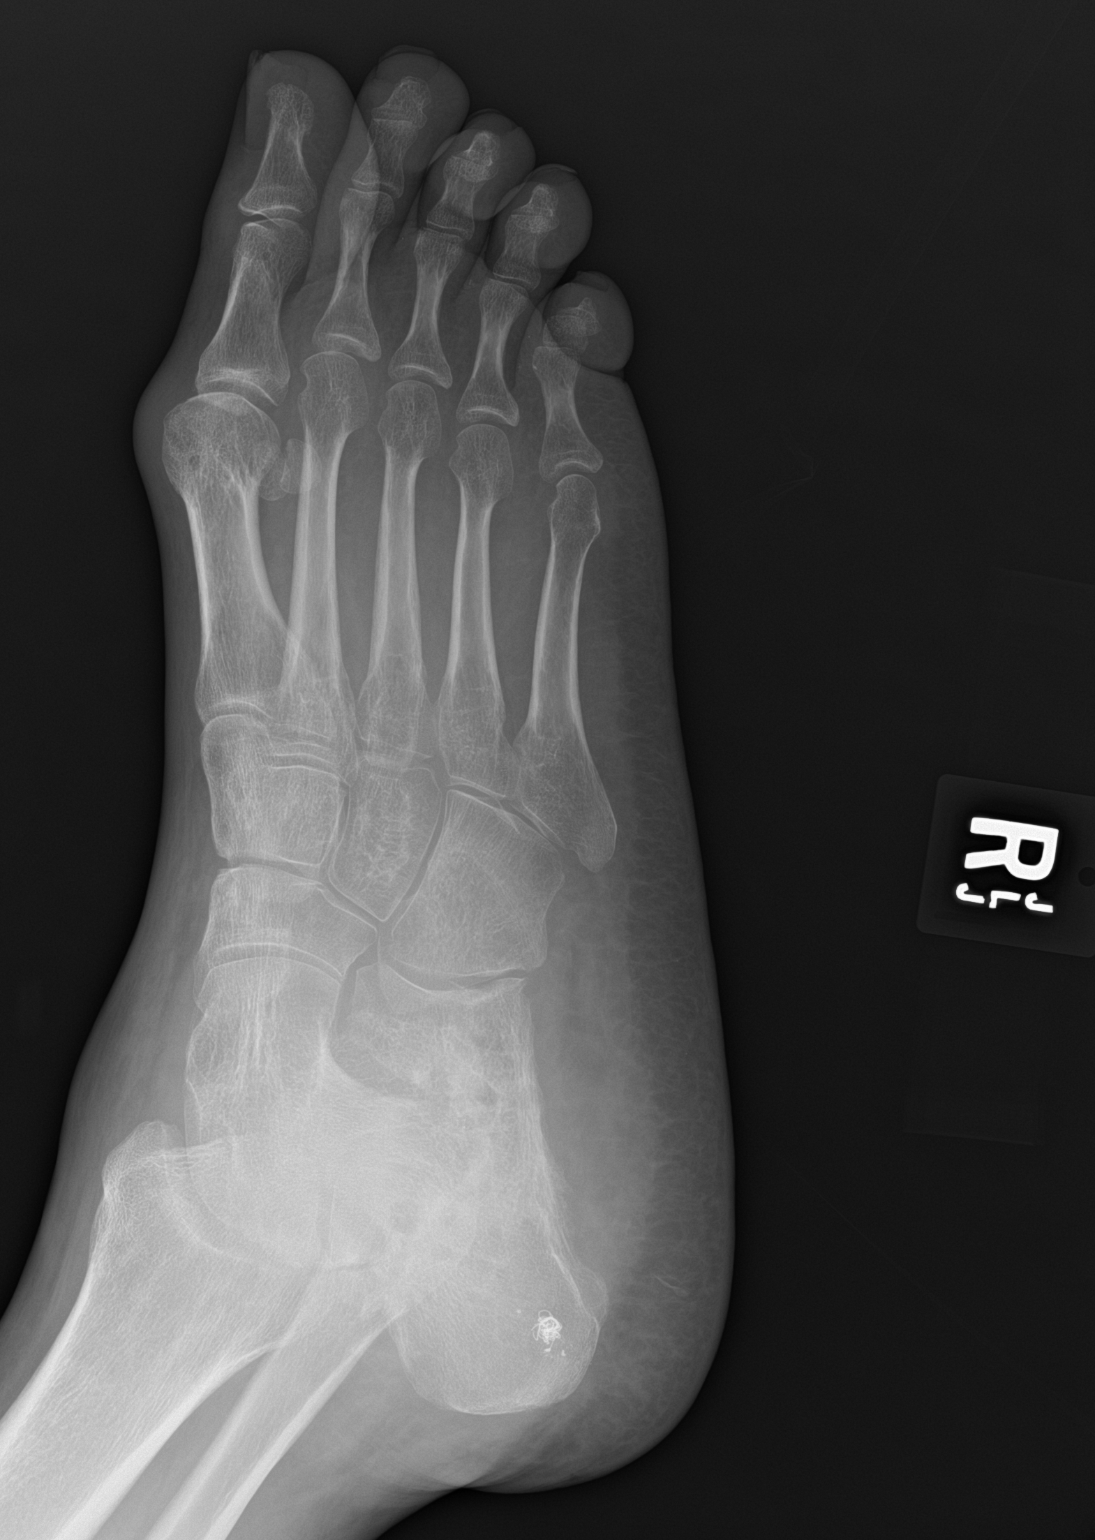

[foot lat]
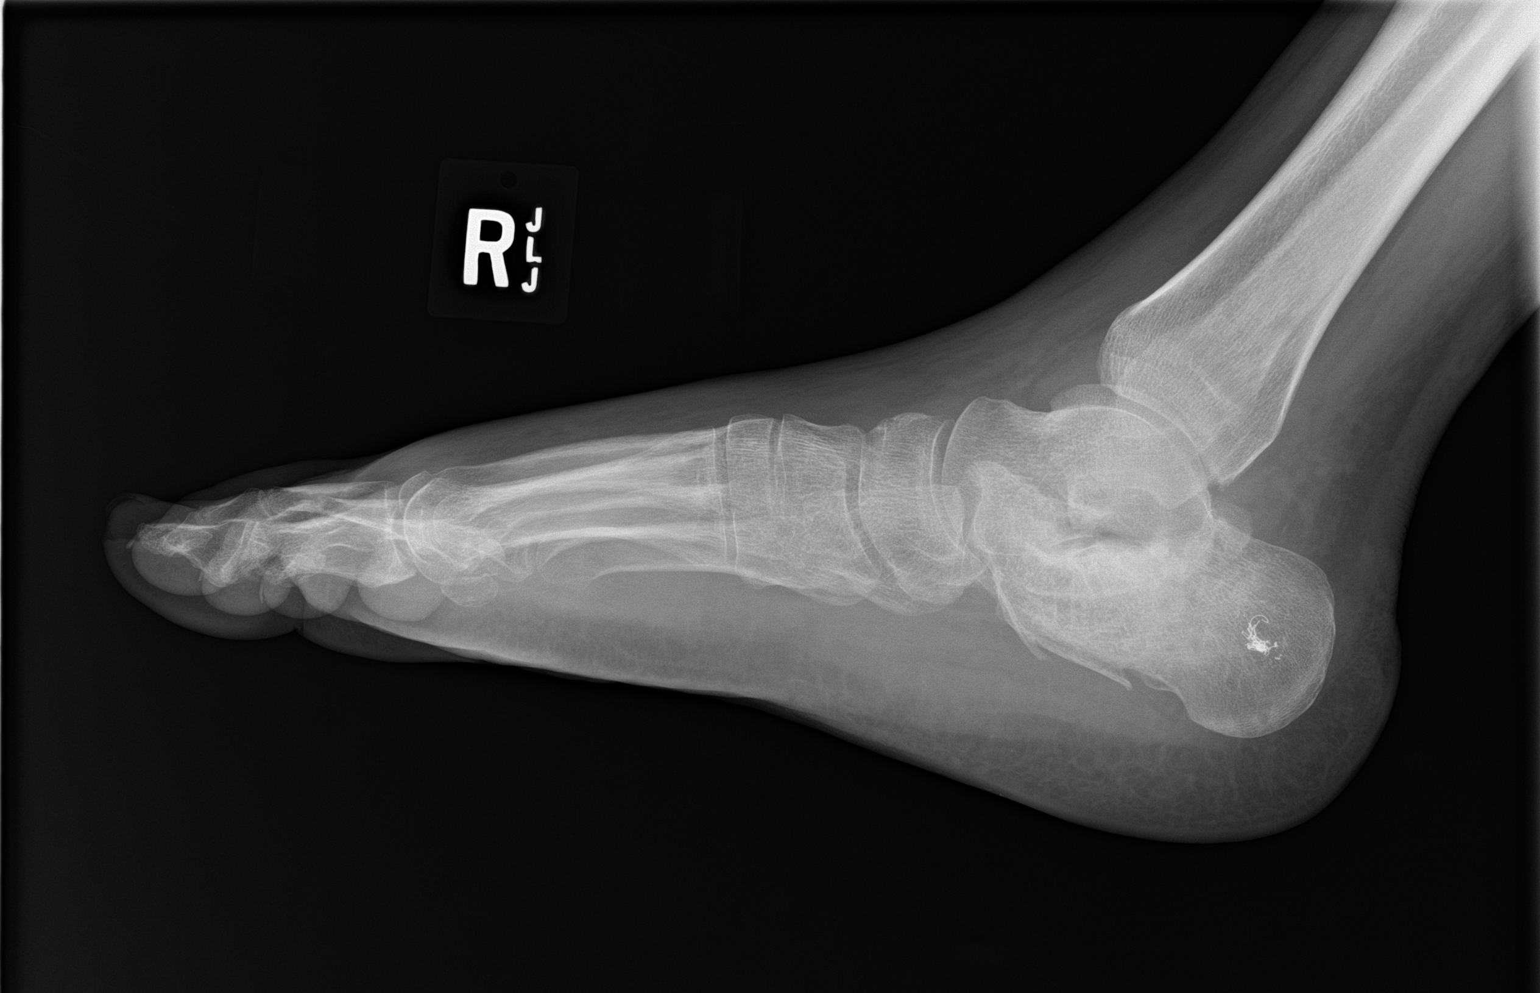

[3 of 3 positions shown; findings below may reference images not displayed]

FINDINGS: Evidence of prior complex comminuted calcaneal fractures with
evidence of hardware removal. Some type of metallic suture noted in
the posterior calcaneus. No acute bony findings or destructive bony
changes.
IMPRESSION: 1. Remote posttraumatic changes involving the calcaneus.
2. No acute bony findings.

## 2020-10-03 IMAGING — DX DG ANKLE COMPLETE 3+V*R*
3 series · 3 of 3 positions shown · non-contrast
Comparison: None.

CLINICAL DATA: Right foot and ankle pain. Remote history of trauma.

EXAM:
RIGHT ANKLE - COMPLETE 3+ VIEW

[ankle ap]
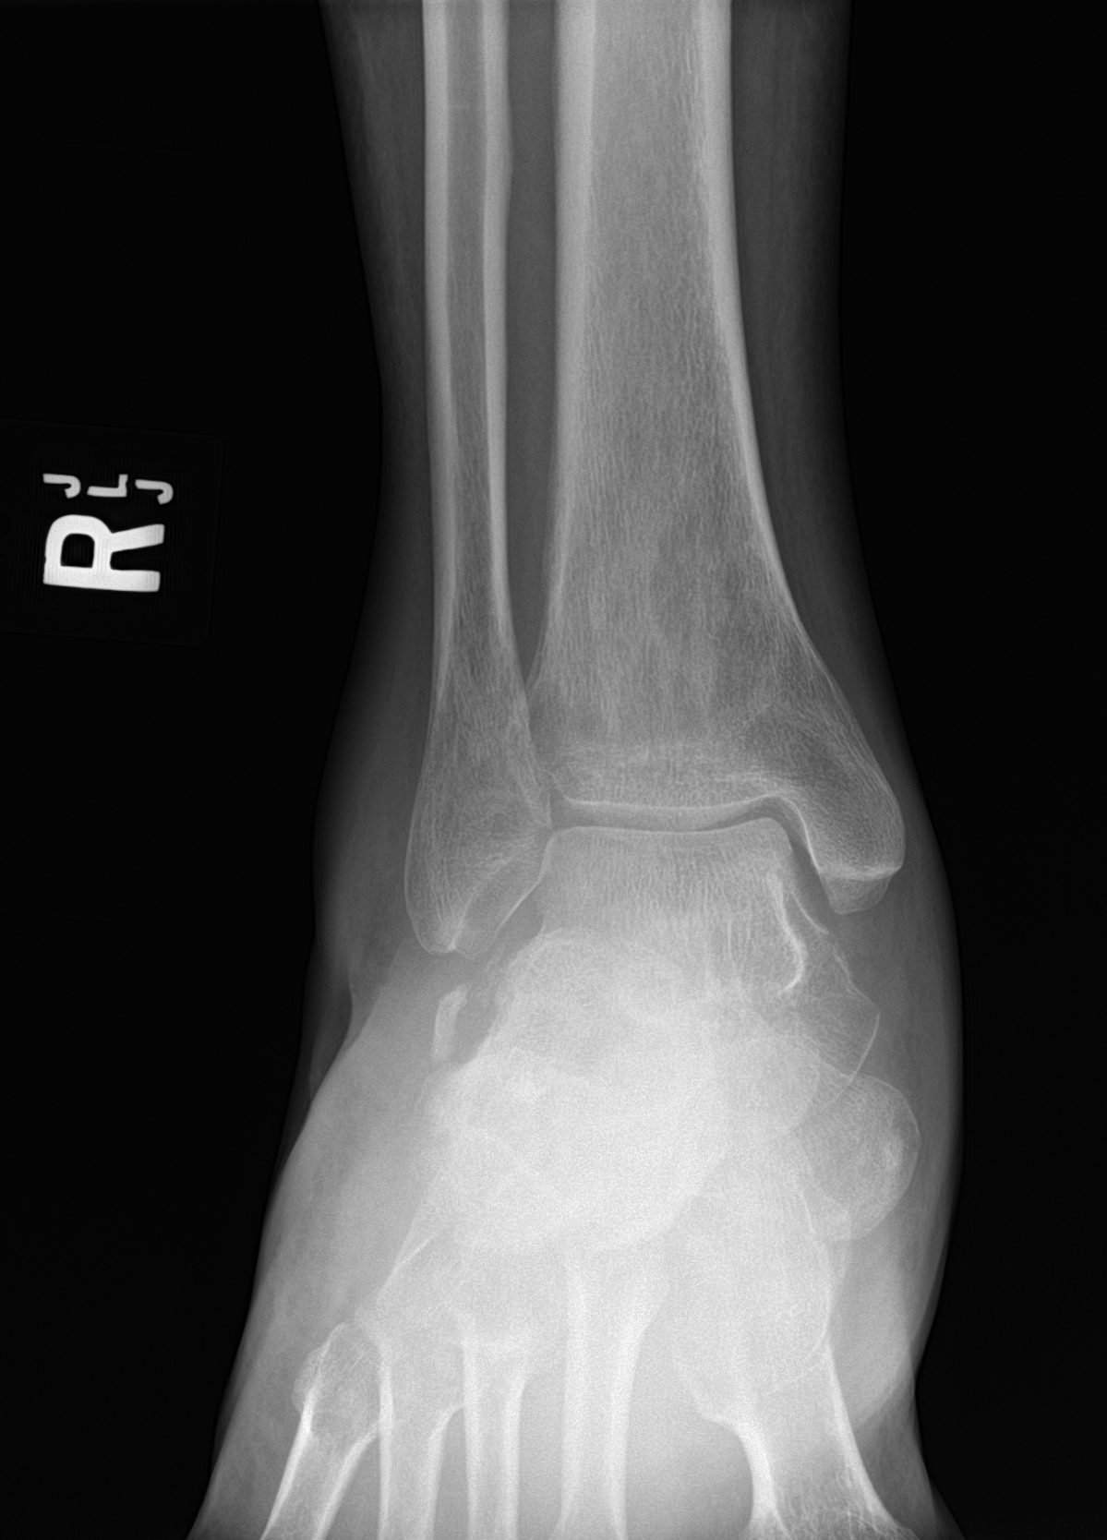

[ankle obl]
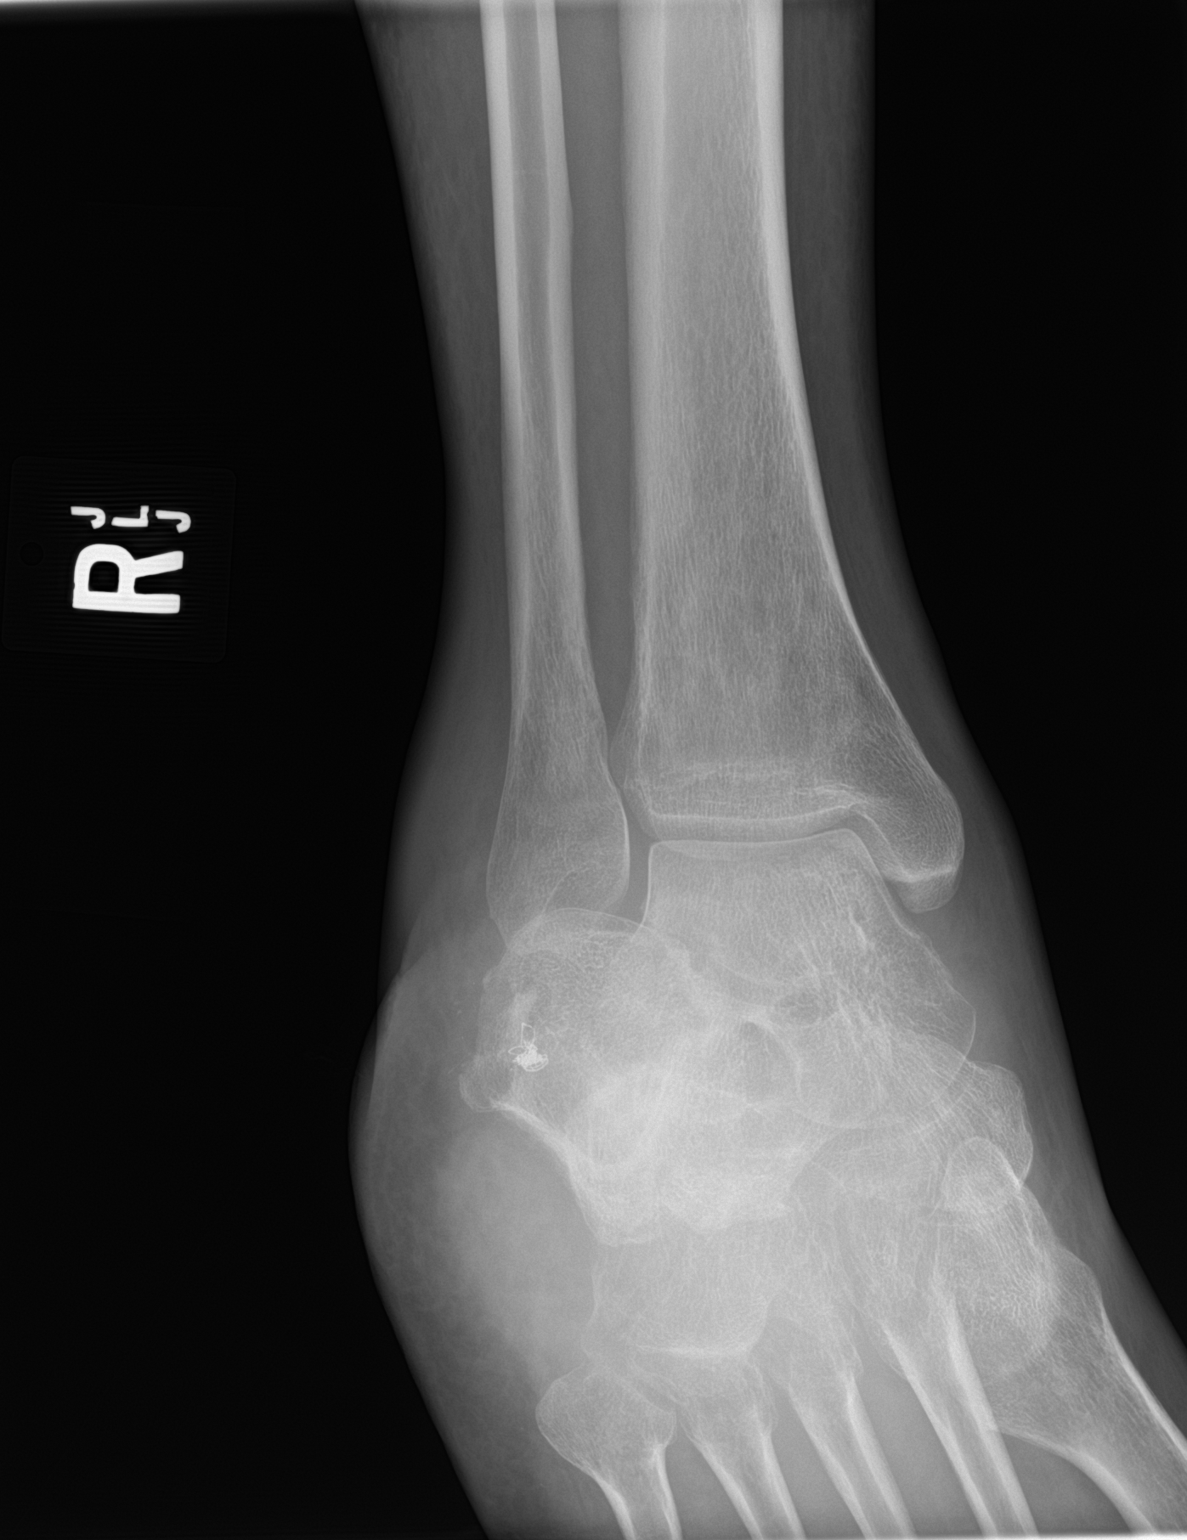

[ankle lat]
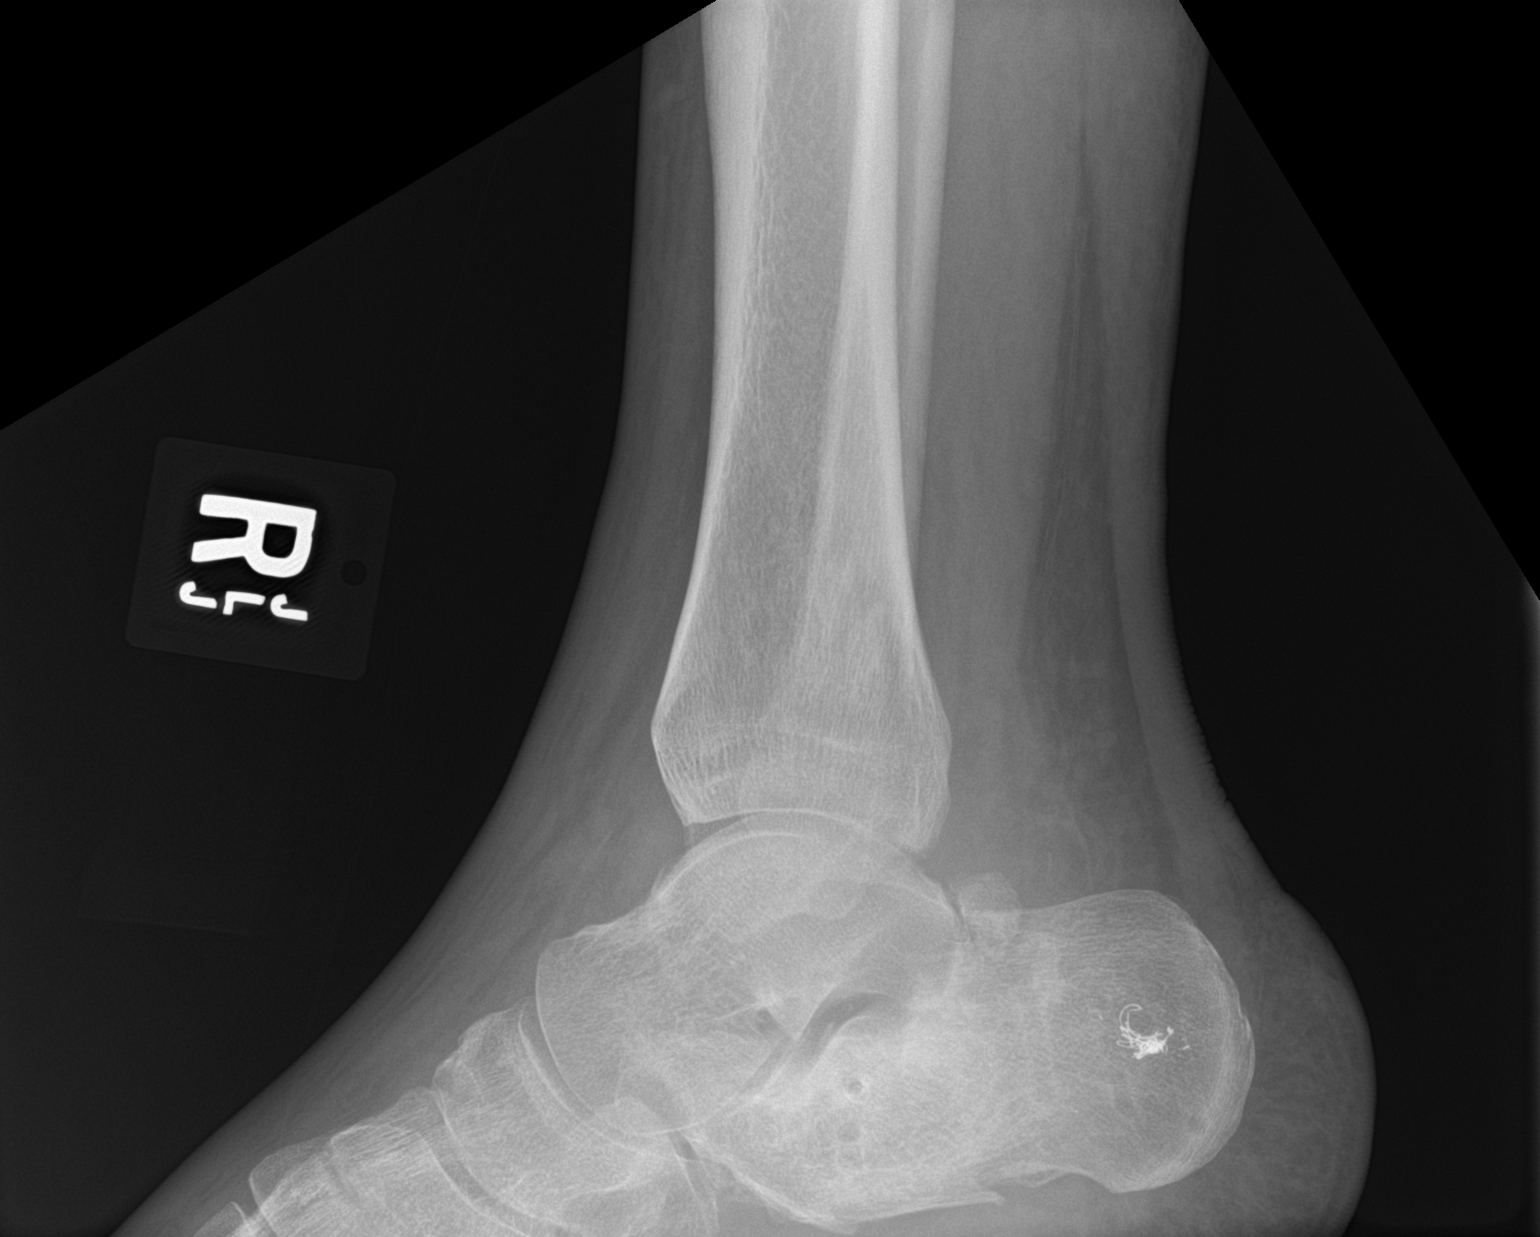

[3 of 3 positions shown; findings below may reference images not displayed]

FINDINGS: Soft tissue swelling and ankle joint effusion. There is osteopenia
of the ankle and visualized foot. Postoperative changes to the
calcaneus which was likely previously fractured given patchy
sclerotic appearance with flattening of Bohler's angle.
IMPRESSION: 1. Soft tissue swelling and ankle joint effusion.
2. Posttraumatic/postoperative changes at the calcaneus.
3. No acute fracture or erosion.
